# Patient Record
Sex: Male | Born: 1950 | Race: White | Hispanic: No | Marital: Married | Smoking: Former smoker
Health system: Southern US, Community
[De-identification: ages and names within clinical notes are randomized; demographics above are authoritative.]

## PROBLEM LIST (undated history)

## (undated) DIAGNOSIS — I10 Essential (primary) hypertension: Secondary | ICD-10-CM

## (undated) DIAGNOSIS — K5792 Diverticulitis of intestine, part unspecified, without perforation or abscess without bleeding: Secondary | ICD-10-CM

## (undated) DIAGNOSIS — H409 Unspecified glaucoma: Secondary | ICD-10-CM

## (undated) DIAGNOSIS — R12 Heartburn: Secondary | ICD-10-CM

## (undated) HISTORY — PX: HERNIA REPAIR: SHX51

---

## 2009-09-10 ENCOUNTER — Ambulatory Visit: Payer: Self-pay | Admitting: Emergency Medicine

## 2009-09-10 DIAGNOSIS — M25519 Pain in unspecified shoulder: Secondary | ICD-10-CM | POA: Insufficient documentation

## 2009-09-10 DIAGNOSIS — Z8719 Personal history of other diseases of the digestive system: Secondary | ICD-10-CM

## 2010-03-22 NOTE — Letter (Signed)
Summary: Work Paediatric nurse [facility]  [address]   [place], [place] [address]   Phone: [phone]  Fax: [phone]    Today's Date: September 10, 2009  Name of Patient: Zachary Howell  The above named patient had a medical visit today at:  10:40 am.  Please take this into consideration when reviewing the time away from work/school.    Special Instructions:  [  ] None  [X]  To be off the remainder of today.  [  ] To be off until the next scheduled appointment on ______________________.  [  ] Other ________________________________________________________________ ________________________________________________________________________   Sincerely yours,   Hoyt Koch MD

## 2010-03-22 NOTE — Assessment & Plan Note (Signed)
Summary: SHOULDER PAIN/KH   Vital Signs:  Patient Profile:   60 Years Old Male CC:      left shoulder pain  Height:     66.5 inches Weight:      152 pounds O2 Sat:      98 % O2 treatment:    Room Air Temp:     98.7 degrees F oral Pulse rate:   80 / minute Resp:     16 per minute BP sitting:   153 / 88  (right arm) Cuff size:   regular  Pt. in pain?   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  Mild aches here and there, but this week L shoulder feels worse.  No known trauma, although he does do a lot of lifting at work.  Pain is somtimes sore, with main symptoms being cracking and popping.  Feels deep in the joint and no pain while pressing on it.  Mobic 7.5mg  takes almost all the pain away but he ran out. No CP, SOB, palpitations, or other cardiopulmonary symptoms.  REVIEW OF SYSTEMS Constitutional Symptoms      Denies fever, chills, night sweats, weight loss, weight gain, and fatigue.  Eyes       Denies change in vision, eye pain, eye discharge, glasses, contact lenses, and eye surgery. Ear/Nose/Throat/Mouth       Denies hearing loss/aids, change in hearing, ear pain, ear discharge, dizziness, frequent runny nose, frequent nose bleeds, sinus problems, sore throat, hoarseness, and tooth pain or bleeding.  Respiratory       Denies dry cough, productive cough, wheezing, shortness of breath, asthma, bronchitis, and emphysema/COPD.  Cardiovascular       Denies murmurs, chest pain, and tires easily with exhertion.    Gastrointestinal       Denies stomach pain,  nausea/vomiting, diarrhea, constipation, blood in bowel movements, and indigestion. Genitourniary       Denies painful urination, kidney stones, and loss of urinary control. Neurological       Denies paralysis, seizures, and fainting/blackouts. Musculoskeletal       Complains of joint pain and decreased range of motion.      Denies muscle pain, joint stiffness, redness, swelling, muscle weakness, and gout.      Comments: left shouder Skin       Denies bruising, unusual mles/lumps or sores, and hair/skin or nail changes.  Psych       Denies mood changes, temper/anger issues, anxiety/stress, speech problems, depression, and sleep problems. 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 from a continuous motion   Past History:  Past Medical History: Diverticulitis, hx of  Past Surgical History: Inguinal herniorrhaphy Diverticulitis- portion of sigmoid colon removed 2006  Family History: MI- father CHF- father  Social History: Occupation: mail carrier Married Quit smoking 20 years ago Alcohol use-yes, 6/week Drug use-no Drug Use:  no Physical Exam General appearance: well developed, well nourished, no  acute distress Chest/Lungs: no rales, wheezes, or rhonchi bilateral, breath sounds equal without effort Heart: regular rate and  rhythm, no murmur Neurological: grossly intact and non-focal Skin: no obvious rashes or lesions Left shoulder: FROM AB, ER, IR.  Full stength.  Normal [place], [place], [place], [place], Speeds.  No TTP clavicle, [place], AC, acromion, coracoid.  Scapula normal movement.  No bruising, swelling. Assessment New Problems: SHOULDER PAIN, LEFT (ICD-719.41) DIVERTICULITIS, HX OF (ICD-V12.79)  Likely OA  Plan New Medications/Changes: MELOXICAM 7.5 MG TABS (MELOXICAM) 1 tab by mouth 1-2 times a day as needed for pain  #60 x 1, 09/10/2009, Hoyt Koch MD  New Orders: New Patient Level III [phone]  The patient  and/or caregiver has been counseled thoroughly with regard to medications prescribed including dosage, schedule, interactions, rationale for use, and possible side effects and they verbalize understanding.  Diagnoses and expected course of recovery discussed and will return if not improved as expected or if the condition worsens. Patient and/or caregiver verbalized understanding.  Prescriptions: MELOXICAM 7.5 MG TABS (MELOXICAM) 1 tab by mouth 1-2 times a day as needed for pain  #60 x 1   Entered and Authorized by:   Hoyt Koch MD   Signed by:   Hoyt Koch MD on 09/10/2009   Method used:   Printed then faxed to ...       [facility] (retail)       [address]       [place], [place]  [address]       Ph: [phone]       Fax: [phone]   RxID:   [id number]   Patient Instructions: 1)  Ice & general rest 2)  Discussed rotator cuff exercises 3)  Take Mobic but caution with your history of GERD 4)  If not improving or worsening in 2 weeks, return to clinic.  We will then get an Xray, and will either send to PT and/or do a Cortisone injection.   5)  If CP, SOB,  go to ER  Orders Added: 1)  New Patient Level III [[id number]]

## 2011-04-25 ENCOUNTER — Encounter: Payer: Self-pay | Admitting: *Deleted

## 2011-04-25 ENCOUNTER — Emergency Department (INDEPENDENT_AMBULATORY_CARE_PROVIDER_SITE_OTHER)
Admission: EM | Admit: 2011-04-25 | Discharge: 2011-04-25 | Disposition: A | Discharge status: Home Health | Payer: Federal, State, Local not specified - PPO | Source: Home / Self Care

## 2011-04-25 DIAGNOSIS — Z23 Encounter for immunization: Secondary | ICD-10-CM

## 2011-04-25 HISTORY — DX: Heartburn: R12

## 2011-04-25 MED ORDER — TETANUS-DIPHTH-ACELL PERTUSSIS 5-2.5-18.5 LF-MCG/0.5 IM SUSP
0.5000 mL | Freq: Once | INTRAMUSCULAR | Status: AC
  Administered 2011-04-25: 0.5 mL via INTRAMUSCULAR

## 2011-07-15 ENCOUNTER — Emergency Department (INDEPENDENT_AMBULATORY_CARE_PROVIDER_SITE_OTHER)
Admission: EM | Admit: 2011-07-15 | Discharge: 2011-07-15 | Disposition: A | Discharge status: Home / Self Care | Payer: Federal, State, Local not specified - PPO | Source: Home / Self Care

## 2011-07-15 DIAGNOSIS — M722 Plantar fascial fibromatosis: Secondary | ICD-10-CM

## 2011-07-15 DIAGNOSIS — M79609 Pain in unspecified limb: Secondary | ICD-10-CM

## 2011-07-15 DIAGNOSIS — I10 Essential (primary) hypertension: Secondary | ICD-10-CM

## 2011-07-15 LAB — BASIC METABOLIC PANEL
BUN: 15 mg/dL (ref 6–23)
Chloride: 102 mEq/L (ref 96–112)
Potassium: 4.2 mEq/L (ref 3.5–5.3)

## 2011-07-15 LAB — CBC
HCT: 40 % (ref 39.0–52.0)
RDW: 12.8 % (ref 11.5–15.5)
WBC: 6.7 10*3/uL (ref 4.0–10.5)

## 2011-07-15 NOTE — Discharge Instructions (Signed)
Plantar Fasciitis Plantar fasciitis is a common condition that causes foot pain. It is soreness (inflammation) of the band of tough fibrous tissue on the bottom of the foot that runs from the heel bone (calcaneus) to the ball of the foot. The cause of this soreness may be from excessive standing, poor fitting shoes, running on hard surfaces, being overweight, having an abnormal walk, or overuse (this is common in runners) of the painful foot or feet. It is also common in aerobic exercise dancers and ballet dancers. SYMPTOMS  Most people with plantar fasciitis complain of:  Severe pain in the morning on the bottom of their foot especially when taking the first steps out of bed. This pain recedes after a few minutes of walking.   Severe pain is experienced also during walking following a long period of inactivity.   Pain is worse when walking barefoot or up stairs  DIAGNOSIS   Your caregiver will diagnose this condition by examining and feeling your foot.   Special tests such as X-rays of your foot, are usually not needed.  PREVENTION   Consult a sports medicine professional before beginning a new exercise program.   Walking programs offer a good workout. With walking there is a lower chance of overuse injuries common to runners. There is less impact and less jarring of the joints.   Begin all new exercise programs slowly. If problems or pain develop, decrease the amount of time or distance until you are at a comfortable level.   Wear good shoes and replace them regularly.   Stretch your foot and the heel cords at the back of the ankle (Achilles tendon) both before and after exercise.   Run or exercise on even surfaces that are not hard. For example, asphalt is better than pavement.   Do not run barefoot on hard surfaces.   If using a treadmill, vary the incline.   Do not continue to workout if you have foot or joint problems. Seek professional help if they do not improve.  HOME CARE  INSTRUCTIONS   Avoid activities that cause you pain until you recover.   Use ice or cold packs on the problem or painful areas after working out.   Only take over-the-counter or prescription medicines for pain, discomfort, or fever as directed by your caregiver.   Soft shoe inserts or athletic shoes with air or gel sole cushions may be helpful.   If problems continue or become more severe, consult a sports medicine caregiver or your own health care provider. Cortisone is a potent anti-inflammatory medication that may be injected into the painful area. You can discuss this treatment with your caregiver.  MAKE SURE YOU:   Understand these instructions.   Will watch your condition.   Will get help right away if you are not doing well or get worse.  Document Released: 11/01/2000 Document Revised: 01/26/2011 Document Reviewed: 01/01/2008 ExitCare Patient Information 2012 [employer].Hypertension As your heart beats, it forces blood through your arteries. This force is your blood pressure. If the pressure is too high, it is called hypertension (HTN) or high blood pressure. HTN is dangerous because you may have it and not know it. High blood pressure may mean that your heart has to work harder to pump blood. Your arteries may be narrow or stiff. The extra work puts you at risk for heart disease, stroke, and other problems.  Blood pressure consists of two numbers, a higher number over a lower, 110/72, for example. It is   stated as "110 over 72." The ideal is below 120 for the top number (systolic) and under 80 for the bottom (diastolic). Write down your blood pressure today. You should pay close attention to your blood pressure if you have certain conditions such as:  Heart failure.   Prior heart attack.   Diabetes   Chronic kidney disease.   Prior stroke.   Multiple risk factors for heart disease.  To see if you have HTN, your blood pressure should be measured while you are seated with  your arm held at the level of the heart. It should be measured at least twice. A one-time elevated blood pressure reading (especially in the Emergency Department) does not mean that you need treatment. There may be conditions in which the blood pressure is different between your right and left arms. It is important to see your caregiver soon for a recheck. Most people have essential hypertension which means that there is not a specific cause. This type of high blood pressure may be lowered by changing lifestyle factors such as:  Stress.   Smoking.   Lack of exercise.   Excessive weight.   Drug/tobacco/alcohol use.   Eating less salt.  Most people do not have symptoms from high blood pressure until it has caused damage to the body. Effective treatment can often prevent, delay or reduce that damage. TREATMENT  When a cause has been identified, treatment for high blood pressure is directed at the cause. There are a large number of medications to treat HTN. These fall into several categories, and your caregiver will help you select the medicines that are best for you. Medications may have side effects. You should review side effects with your caregiver. If your blood pressure stays high after you have made lifestyle changes or started on medicines,   Your medication(s) may need to be changed.   Other problems may need to be addressed.   Be certain you understand your prescriptions, and know how and when to take your medicine.   Be sure to follow up with your caregiver within the time frame advised (usually within two weeks) to have your blood pressure rechecked and to review your medications.   If you are taking more than one medicine to lower your blood pressure, make sure you know how and at what times they should be taken. Taking two medicines at the same time can result in blood pressure that is too low.  SEEK IMMEDIATE MEDICAL CARE IF:  You develop a severe headache, blurred or  changing vision, or confusion.   You have unusual weakness or numbness, or a faint feeling.   You have severe chest or abdominal pain, vomiting, or breathing problems.  MAKE SURE YOU:   Understand these instructions.   Will watch your condition.   Will get help right away if you are not doing well or get worse.  Document Released: 02/06/2005 Document Revised: 01/26/2011 Document Reviewed: 09/27/2007 ExitCare Patient Information 2012 [employer]. 

## 2011-07-15 NOTE — ED Provider Notes (Signed)
Agree with exam, assessment, and plan.   Lattie Haw, MD 07/15/11 [phone]

## 2011-07-15 NOTE — ED Provider Notes (Signed)
History     CSN: [id number]  Arrival date & time 07/15/11  1133   First MD Initiated Contact with Patient 07/15/11 1155      Chief Complaint  Patient presents with  . Foot Pain    2 weeks    (Consider location/radiation/quality/duration/timing/severity/associated sxs/prior treatment) HPI Comments: Has had heel pain x 2 weeks.  Pain worse in am upon walking. Improved during throughout the day though still persists.  Works as a Museum/gallery curator.  No paresthesias or numbness.  No recent ankle traumas.    Patient is a 61 y.o. male presenting with lower extremity pain. The history is provided by the patient.  Foot Pain This is a new problem. The current episode started more than 1 week ago. The problem occurs daily. The problem has not changed since onset.Pertinent negatives include no chest pain, no abdominal pain, no headaches and no shortness of breath. The symptoms are aggravated by walking. The symptoms are relieved by NSAIDs. Treatments tried: NSAIDs  The treatment provided mild relief.    Past Medical History  Diagnosis Date  . Heart burn     Past Surgical History  Procedure Date  . Hernia repair     Family History  Problem Relation Age of Onset  . Cancer Mother     sarcoma  . Heart failure Father     History  Substance Use Topics  . Smoking status: Former Smoker -- 0.5 packs/day for 15 years  . Smokeless tobacco: Never Used  . Alcohol Use: Yes      Review of Systems  Respiratory: Negative for shortness of breath.   Cardiovascular: Negative for chest pain.  Gastrointestinal: Negative for abdominal pain.  Neurological: Negative for headaches.    Allergies  Review of patient's allergies indicates no known allergies.  Home Medications   Current Outpatient Rx  Name Route Sig Dispense Refill  . NAPROXEN SODIUM 220 MG PO CAPS Oral Take 440 mg by mouth once.      BP 179/98  Pulse 75  Temp(Src) 98.7 F (37.1 C) (Oral)  Resp 16  Ht 5\' 4"  (1.626 m)  Wt  157 lb (71.215 kg)  BMI 26.95 kg/m2  SpO2 99%  Physical Exam  Constitutional: He appears well-developed and well-nourished.  HENT:  Head: Normocephalic.  Eyes: Conjunctivae are normal. Pupils are equal, round, and reactive to light.  Neck: Normal range of motion. Neck supple.  Cardiovascular: Normal rate and regular rhythm.   Pulmonary/Chest: Effort normal and breath sounds normal.  Ankle: No visible erythema or swelling. Range of motion is full in all directions. Strength is 5/5 in all directions. Stable lateral and medial ligaments; squeeze test and kleiger test unremarkable; Talar dome nontender; No pain at base of 5th MT; No tenderness over cuboid; No tenderness over N spot or navicular prominence No tenderness on posterior aspects of lateral and medial malleolus No sign of peroneal tendon subluxations; Negative tarsal tunnel tinel's Able to walk 4 steps. + pain at insertion point of plantar fascia at calcaneus (mild) + Pain with heel walking.    ED Course  Procedures (including critical care time)   Labs Reviewed  CBC  BASIC METABOLIC PANEL   No results found.   No diagnosis found.    MDM  Exam clinically consistent with plantar fasciitis.  Will place heel lift.  Tylenol and exercises.  Follow up with sports medicine in 1-2 weeks.   Discussed NSAID avodiance in setting of elevated BPs.  Checking BUN and Cr pending  outpt follow up. No CV red flags currently.         Floydene Flock, MD 07/15/11 1233

## 2011-07-15 NOTE — ED Notes (Signed)
Patient complains of left heel pain 2 weeks. Saed does not remember injuring his foot or heel. He describes the pain as an aching, throbbing pain that is a 4/10 on the pain scale. He does take Aleve daily but the pain continues to get worse.

## 2012-12-05 ENCOUNTER — Emergency Department (INDEPENDENT_AMBULATORY_CARE_PROVIDER_SITE_OTHER)
Admission: EM | Admit: 2012-12-05 | Discharge: 2012-12-05 | Disposition: A | Discharge status: Home / Self Care | Payer: Federal, State, Local not specified - PPO | Source: Home / Self Care | Attending: Emergency Medicine | Admitting: Emergency Medicine

## 2012-12-05 ENCOUNTER — Encounter: Payer: Self-pay | Admitting: Emergency Medicine

## 2012-12-05 DIAGNOSIS — M545 Low back pain: Secondary | ICD-10-CM

## 2012-12-05 MED ORDER — CYCLOBENZAPRINE HCL 10 MG PO TABS: 10.0000 mg | ORAL_TABLET | Freq: Three times a day (TID) | ORAL | Status: DC | PRN

## 2012-12-05 MED ORDER — TRAMADOL HCL 50 MG PO TABS: 50.0000 mg | ORAL_TABLET | Freq: Four times a day (QID) | ORAL | Status: DC | PRN

## 2012-12-05 NOTE — ED Notes (Signed)
Lower Left back pain started yesterday morning, no trauma

## 2012-12-05 NOTE — ED Provider Notes (Signed)
CSN: [id number]     Arrival date & time 12/05/12  1111 History   First MD Initiated Contact with Patient 12/05/12 1127     Chief Complaint  Patient presents with  . Back Pain   (Consider location/radiation/quality/duration/timing/severity/associated sxs/prior Treatment) HPI The patient presents today with back pain. Location:  Left lower back.  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   No particular definite injury or action, but feels spasm and tight.  Finished working today and was feeling a little better, but then tightened up again.  He did have similar pain about 2 years ago which was a worker's comp injury.  He feels it was the same side and location.   Timing: 2 days Worse with: movement, rotation, bending Better with: ice, heat, rest Trauma: no Bladder/bowel incontinence: no Weakness: no Fever/chills: no Night pain: no Unexplained weight loss: no Cancer/immunosuppression: no PMH of osteoporosis or chronic steroid use:  no  No UTI symptoms.  Past Medical History  Diagnosis Date  . Heart burn    Past Surgical History  Procedure Laterality Date  . Hernia repair     Family History  Problem Relation Age of Onset  . Cancer Mother     sarcoma  . Heart failure Father    History  Substance Use Topics  . Smoking status: Former Smoker -- 0.50 packs/day for 15 years  . Smokeless tobacco: Never Used  . Alcohol Use: Yes    Review of Systems  All other systems reviewed and are negative.    Allergies  Review of patient's allergies indicates not on file.  Home Medications   Current Outpatient Rx  Name  Route  Sig  Dispense  Refill  . omeprazole (PRILOSEC) 10 MG capsule   Oral   Take 10 mg by mouth daily.         . Travoprost, BAK Free, (TRAVATAN) 0.004 % SOLN ophthalmic solution      1 drop at bedtime.         . cyclobenzaprine (FLEXERIL) 10 MG tablet   Oral   Take 1 tablet (10 mg total) by  mouth 3 (three) times daily as needed for muscle spasms.   21 tablet   0   . Naproxen Sodium 220 MG CAPS   Oral   Take 440 mg by mouth once.         . traMADol (ULTRAM) 50 MG tablet   Oral   Take 1 tablet (50 mg total) by mouth every 6 (six) hours as needed for pain.   20 tablet   0    BP 172/95  Pulse 88  Temp(Src) 98.2 F (36.8 C) (Oral)  Ht 5\' 6"  (1.676 m)  Wt 166 lb (75.297 kg)  BMI 26.81 kg/m2  SpO2 97% Physical Exam  Nursing note and vitals reviewed. Constitutional: He is oriented to person, place, and time. He appears well-developed and well-nourished.  HENT:  Head: Normocephalic and atraumatic.  Eyes: No scleral icterus.  Neck: Neck supple.  Cardiovascular: Regular rhythm and normal heart sounds.   Pulmonary/Chest: Effort normal and breath sounds normal. No respiratory distress.  Musculoskeletal:       Lumbar back: He exhibits spasm. He exhibits no bony tenderness.  L lumbar mild spasm and minimal tenderness.  Left SI dysfunction on forward bend.  SLR negative.  Distal NV intact.  No CVA tenderness.  Neurological: He is alert and oriented to  person, place, and time. He has normal strength and normal reflexes. No sensory deficit.  Skin: Skin is warm and dry. No rash noted.  Psychiatric: He has a normal mood and affect. His speech is normal.    ED Course  Procedures (including critical care time) Labs Review Labs Reviewed - No data to display Imaging Review No results found.  EKG Interpretation     Ventricular Rate:    PR Interval:    QRS Duration:   QT Interval:    QTC Calculation:   R Axis:     Text Interpretation:              MDM   1. Lumbago    1) Left LBP, likely muscular.  Will Rx Flexeril + Tramadol. Encourage heat and general rest.  Work note given for tomorrow.  Will need to f/u if pain continuing and consider Xrays at that time vs PT. 2) Continue to monitor BP outpatient and keep a log.  ER precautions given for HTN urgency  symptoms as explained.  I believe some of this is white coat, but believe he likely needs an antihypertensive med and needs to f/u with his PCP for this.    Marlaine Hind, MD 12/05/12 1149

## 2012-12-06 ENCOUNTER — Telehealth: Payer: Self-pay | Admitting: Emergency Medicine

## 2014-07-17 ENCOUNTER — Emergency Department
Admission: EM | Admit: 2014-07-17 | Discharge: 2014-07-17 | Disposition: A | Discharge status: Home / Self Care | Payer: Federal, State, Local not specified - PPO | Source: Home / Self Care | Attending: Family Medicine | Admitting: Family Medicine

## 2014-07-17 ENCOUNTER — Emergency Department (INDEPENDENT_AMBULATORY_CARE_PROVIDER_SITE_OTHER): Payer: Federal, State, Local not specified - PPO

## 2014-07-17 ENCOUNTER — Encounter: Payer: Self-pay | Admitting: Emergency Medicine

## 2014-07-17 DIAGNOSIS — M479 Spondylosis, unspecified: Secondary | ICD-10-CM

## 2014-07-17 DIAGNOSIS — S39012A Strain of muscle, fascia and tendon of lower back, initial encounter: Secondary | ICD-10-CM

## 2014-07-17 HISTORY — DX: Unspecified glaucoma: H40.9

## 2014-07-17 HISTORY — DX: Essential (primary) hypertension: I10

## 2014-07-17 HISTORY — DX: Diverticulitis of intestine, part unspecified, without perforation or abscess without bleeding: K57.92

## 2014-07-17 MED ORDER — CYCLOBENZAPRINE HCL 10 MG PO TABS: 10.0000 mg | ORAL_TABLET | Freq: Three times a day (TID) | ORAL | Status: DC | PRN

## 2014-07-17 MED ORDER — MELOXICAM 15 MG PO TABS: 15.0000 mg | ORAL_TABLET | Freq: Every day | ORAL | Status: DC

## 2014-07-17 MED ORDER — PREDNISONE 20 MG PO TABS
20.0000 mg | ORAL_TABLET | Freq: Two times a day (BID) | ORAL | Status: DC
Start: 2014-07-17 — End: 2014-11-16

## 2014-07-17 NOTE — ED Provider Notes (Signed)
CSN: [id number]     Arrival date & time 07/17/14  0813 History   First MD Initiated Contact with Patient 07/17/14 [phone]     Chief Complaint  Patient presents with  . Back Pain      HPI Comments: Patient awoke yesterday with sharp non-radiating pain in his right lower back.  The pain is worse with twisting movements.  He recalls no injury, but two days prior he had been tilling his garden with a [employer].   He denies bowel or bladder dysfunction, and no saddle numbness.    Patient is a 64 y.o. male presenting with back pain. The history is provided by the patient.  Back Pain Location:  Lumbar spine Quality:  Stabbing and shooting Radiates to:  Does not radiate Pain severity:  Mild Pain is:  Worse during the day Onset quality:  Sudden Duration:  2 days Timing:  Intermittent Progression:  Unchanged Chronicity:  Recurrent Context: twisting   Relieved by:  Nothing Worsened by:  Bending, movement and twisting Ineffective treatments:  NSAIDs and muscle relaxants Associated symptoms: no abdominal pain, no bladder incontinence, no bowel incontinence, no chest pain, no dysuria, no fever, no leg pain, no numbness, no paresthesias, no pelvic pain, no perianal numbness, no tingling, no weakness and no weight loss     Past Medical History  Diagnosis Date  . Heart burn   . Hypertension   . Glaucoma   . Diverticulitis    Past Surgical History  Procedure Laterality Date  . Hernia repair     Family History  Problem Relation Age of Onset  . Cancer Mother     sarcoma  . Heart failure Father    History  Substance Use Topics  . Smoking status: Former Smoker -- 0.50 packs/day for 15 years  . Smokeless tobacco: Never Used  . Alcohol Use: Yes    Review of Systems  Constitutional: Negative for fever and weight loss.  Cardiovascular: Negative for chest pain.  Gastrointestinal: Negative for abdominal pain and bowel incontinence.  Genitourinary: Negative for bladder incontinence,  dysuria and pelvic pain.  Musculoskeletal: Positive for back pain.  Neurological: Negative for tingling, weakness, numbness and paresthesias.  All other systems reviewed and are negative.   Allergies  Review of patient's allergies indicates no known allergies.  Home Medications   Prior to Admission medications   Medication Sig Start Date End Date Taking? Authorizing Provider  benazepril-hydrochlorthiazide (LOTENSIN HCT) 10-12.5 MG per tablet Take 1 tablet by mouth daily.   Yes Historical Provider, MD  timolol (BETIMOL) 0.25 % ophthalmic solution 1-2 drops 2 (two) times daily.   Yes Historical Provider, MD  cyclobenzaprine (FLEXERIL) 10 MG tablet Take 1 tablet (10 mg total) by mouth 3 (three) times daily as needed for muscle spasms. 07/17/14   Lattie Haw, MD  meloxicam (MOBIC) 15 MG tablet Take 1 tablet (15 mg total) by mouth daily. Take with food each morning 07/17/14   Lattie Haw, MD  Naproxen Sodium 220 MG CAPS Take 440 mg by mouth once.    Historical Provider, MD  omeprazole (PRILOSEC) 10 MG capsule Take 10 mg by mouth daily.    Historical Provider, MD  predniSONE (DELTASONE) 20 MG tablet Take 1 tablet (20 mg total) by mouth 2 (two) times daily. Take with food. 07/17/14   Lattie Haw, MD  traMADol (ULTRAM) 50 MG tablet Take 1 tablet (50 mg total) by mouth every 6 (six) hours as needed for pain. 12/05/12  Marlaine HindJeffrey H Henderson, MD  Travoprost, BAK Free, (TRAVATAN) 0.004 % SOLN ophthalmic solution 1 drop at bedtime.    Historical Provider, MD   BP 116/66 mmHg  Pulse 65  Temp(Src) 98.2 F (36.8 C) (Oral)  Resp 16  Ht 5\' 6"  (1.676 m)  Wt 160 lb (72.576 kg)  BMI 25.84 kg/m2  SpO2 97% Physical Exam  Constitutional: He is oriented to person, place, and time. He appears well-developed and well-nourished. No distress.  HENT:  Head: Normocephalic.  Eyes: Pupils are equal, round, and reactive to light.  Neck: Normal range of motion.  Cardiovascular: Normal heart sounds.     Pulmonary/Chest: Breath sounds normal.  Abdominal: There is no tenderness.  Musculoskeletal:       Lumbar back: He exhibits decreased range of motion and pain. He exhibits no tenderness, no bony tenderness, no swelling and no edema.       Back:  Back: Decreased range of motion.  Can heel/toe walk and squat without difficulty.   No tenderness to palpation; area of patient's pain marked in blue on diagram.  Straight leg raising test is negative.  Sitting knee extension test is negative.  Strength and sensation in the lower extremities is normal.  Patellar and achilles reflexes are normal   Neurological: He is oriented to person, place, and time.  Skin: Skin is warm and dry. No rash noted.  Nursing note and vitals reviewed.   ED Course  Procedures  none  Imaging Review Dg Lumbar Spine Complete  07/17/2014   CLINICAL DATA:  Recurrent left lower back pain for 2 days. No known injury. Initial encounter.  EXAM: LUMBAR SPINE - COMPLETE 4+ VIEW  COMPARISON:  None.  FINDINGS: There are 5 lumbar type vertebral bodies. The alignment is normal. The disc spaces are preserved. There is mild intervertebral spurring, greatest at L3-4. No evidence of acute fracture or pars defect. Small pelvic calcifications are likely phleboliths and dystrophic calcifications within the prostate gland.  IMPRESSION: Mild spondylosis.  No acute osseous findings or malalignment.   Electronically Signed   By: Carey BullocksWilliam  Veazey M.D.   On: 07/17/2014 09:39     MDM   1. Low back strain, initial encounter    Begin prednisone burst.  Rx for Flexeril and Mobic.  Apply ice pack for 20 to 30 minutes, 3 to 4 times daily  Continue until pain decreases.  Begin back exercises when tolerated. Begin Mobic (meloxicam) after finishing prednisone. Followup with Dr. Rodney Langtonhomas Thekkekandam ([facility]) if not improving about two weeks.     Lattie HawStephen A Amely Voorheis, MD 07/23/14 1316

## 2014-07-17 NOTE — ED Notes (Signed)
Reports awakening yesterday with shooting pain left low back; cannot think of any movement/activity that would have caused it.

## 2014-07-17 NOTE — ED Notes (Signed)
Took old rx for tramadol and flexeril.

## 2014-07-17 NOTE — Discharge Instructions (Signed)
Apply ice pack for 20 to 30 minutes, 3 to 4 times daily  Continue until pain decreases.  Begin back exercises when tolerated. Begin Mobic (meloxicam) after finishing prednisone.   Back Pain, Adult Low back pain is very common. About 1 in 5 people have back pain.The cause of low back pain is rarely dangerous. The pain often gets better over time.About half of people with a sudden onset of back pain feel better in just 2 weeks. About 8 in 10 people feel better by 6 weeks.  CAUSES Some common causes of back pain include:  Strain of the muscles or ligaments supporting the spine.  Wear and tear (degeneration) of the spinal discs.  Arthritis.  Direct injury to the back. DIAGNOSIS Most of the time, the direct cause of low back pain is not known.However, back pain can be treated effectively even when the exact cause of the pain is unknown.Answering your caregiver's questions about your overall health and symptoms is one of the most accurate ways to make sure the cause of your pain is not dangerous. If your caregiver needs more information, he or she may order lab work or imaging tests (X-rays or MRIs).However, even if imaging tests show changes in your back, this usually does not require surgery. HOME CARE INSTRUCTIONS For many people, back pain returns.Since low back pain is rarely dangerous, it is often a condition that people can learn to [facility] their own.   Remain active. It is stressful on the back to sit or stand in one place. Do not sit, drive, or stand in one place for more than 30 minutes at a time. Take short walks on level surfaces as soon as pain allows.Try to increase the length of time you walk each day.  Do not stay in bed.Resting more than 1 or 2 days can delay your recovery.  Do not avoid exercise or work.Your body is made to move.It is not dangerous to be active, even though your back may hurt.Your back will likely heal faster if you return to being active before your  pain is gone.  Pay attention to your body when you bend and lift. Many people have less discomfortwhen lifting if they bend their knees, keep the load close to their bodies,and avoid twisting. Often, the most comfortable positions are those that put less stress on your recovering back.  Find a comfortable position to sleep. Use a firm mattress and lie on your side with your knees slightly bent. If you lie on your back, put a pillow under your knees.  Only take over-the-counter or prescription medicines as directed by your caregiver. Over-the-counter medicines to reduce pain and inflammation are often the most helpful.Your caregiver may prescribe muscle relaxant drugs.These medicines help dull your pain so you can more quickly return to your normal activities and healthy exercise.  Put ice on the injured area.  Put ice in a plastic bag.  Place a towel between your skin and the bag.  Leave the ice on for 15-20 minutes, 03-04 times a day for the first 2 to 3 days. After that, ice and heat may be alternated to reduce pain and spasms.  Ask your caregiver about trying back exercises and gentle massage. This may be of some benefit.  Avoid feeling anxious or stressed.Stress increases muscle tension and can worsen back pain.It is important to recognize when you are anxious or stressed and learn ways to manage it.Exercise is a great option. SEEK MEDICAL CARE IF:  You have pain  that is not relieved with rest or medicine.  You have pain that does not improve in 1 week.  You have new symptoms.  You are generally not feeling well. SEEK IMMEDIATE MEDICAL CARE IF:   You have pain that radiates from your back into your legs.  You develop new bowel or bladder control problems.  You have unusual weakness or numbness in your arms or legs.  You develop nausea or vomiting.  You develop abdominal pain.  You feel faint. Document Released: 02/06/2005 Document Revised: 08/08/2011 Document  Reviewed: 06/10/2013 [facility] Patient Information 2015 [place], [place]. This information is not intended to replace advice given to you by your health care provider. Make sure you discuss any questions you have with your health care provider.

## 2014-11-16 ENCOUNTER — Emergency Department
Admission: EM | Admit: 2014-11-16 | Discharge: 2014-11-16 | Disposition: A | Discharge status: Home / Self Care | Payer: Federal, State, Local not specified - PPO | Source: Home / Self Care | Attending: Family Medicine | Admitting: Family Medicine

## 2014-11-16 ENCOUNTER — Encounter: Payer: Self-pay | Admitting: *Deleted

## 2014-11-16 DIAGNOSIS — S39012A Strain of muscle, fascia and tendon of lower back, initial encounter: Secondary | ICD-10-CM | POA: Diagnosis not present

## 2014-11-16 MED ORDER — CYCLOBENZAPRINE HCL 10 MG PO TABS: ORAL_TABLET | ORAL | Status: DC

## 2014-11-16 MED ORDER — PREDNISONE 50 MG PO TABS: ORAL_TABLET | ORAL | Status: DC

## 2014-11-16 MED ORDER — TRAMADOL HCL 50 MG PO TABS: 50.0000 mg | ORAL_TABLET | Freq: Four times a day (QID) | ORAL | Status: AC | PRN

## 2014-11-16 MED ORDER — MELOXICAM 15 MG PO TABS: 15.0000 mg | ORAL_TABLET | Freq: Every day | ORAL | Status: AC

## 2014-11-16 NOTE — Discharge Instructions (Signed)
Apply ice pack for 20 to 30 minutes, 3 to 4 times daily  Continue until pain decreases.  Begin Mobic after finishing prednisone.  Avoid lifting.  Begin back stretching and range of motion exercises as tolerated.

## 2014-11-16 NOTE — ED Provider Notes (Signed)
CSN: [id number]     Arrival date & time 11/16/14  1043 History   First MD Initiated Contact with Patient 11/16/14 1119     Chief Complaint  Patient presents with  . Back Pain      HPI Comments: Patient reports that he was moving branches about five days ago.  The next day he developed pain in his lower back, worse on the left, that has persisted.  He had a similar episode 4 months ago.  The pain does not radiate.   He denies bowel or bladder dysfunction, and no saddle numbness. 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  Patient is a 64 y.o. male presenting with back pain. The history is provided by the patient.  Back Pain Location:  Lumbar spine Quality:  Aching Radiates to:  Does not radiate Pain severity:  Moderate Pain is:  Worse during the day Onset quality:  Sudden Duration:  4 days Timing:  Constant Progression:  Unchanged Chronicity:  Recurrent Context: lifting heavy objects   Context: not falling and not occupational injury   Relieved by:  Nothing Worsened by:  Movement, bending and twisting Ineffective treatments:  NSAIDs Associated symptoms: no abdominal pain, no abdominal swelling, no bladder incontinence, no bowel incontinence, no chest pain, no dysuria, no fever, no headaches, no leg pain, no numbness, no paresthesias, no pelvic pain, no perianal numbness, no tingling, no weakness and no weight loss     Past Medical History  Diagnosis Date  . Heart burn   . Hypertension   . Glaucoma   . Diverticulitis    Past Surgical History  Procedure Laterality Date  . Hernia repair     Family History  Problem Relation Age of Onset  . Cancer Mother     sarcoma  . Heart failure Father    Social History  Substance Use Topics  . Smoking status: Former Smoker -- 0.50 packs/day for 15 years  . Smokeless tobacco: Never Used  . Alcohol Use: Yes    Review of Systems  Constitutional: Negative for fever and weight loss.  Cardiovascular:  Negative for chest pain.  Gastrointestinal: Negative for abdominal pain and bowel incontinence.  Genitourinary: Negative for bladder incontinence, dysuria and pelvic pain.  Musculoskeletal: Positive for back pain.  Neurological: Negative for tingling, weakness, numbness, headaches and paresthesias.  All other systems reviewed and are negative.   Allergies  Review of patient's allergies indicates no known allergies.  Home Medications   Prior to Admission medications   Medication Sig Start Date End Date Taking? Authorizing Tiandre Teall  benazepril-hydrochlorthiazide (LOTENSIN HCT) 10-12.5 MG per tablet Take 1 tablet by mouth daily.   Yes Historical Kameryn Tisdel, MD  Naproxen Sodium 220 MG CAPS Take 440 mg by mouth once.   Yes Historical Amron Guerrette, MD  cyclobenzaprine (FLEXERIL) 10 MG tablet Take one tab by mouth at bedtime for muscle spasm 11/16/14   Lattie Haw, MD  meloxicam (MOBIC) 15 MG tablet Take 1 tablet (15 mg total) by mouth daily. Take with food each morning 11/16/14   Lattie Haw, MD  omeprazole (PRILOSEC) 10 MG capsule Take 10 mg by mouth daily.    Historical Yarelin Reichardt, MD  predniSONE (DELTASONE) 50 MG tablet Take one tab by mouth with food once daily for five days 11/16/14   Lattie Haw, MD  timolol (BETIMOL) 0.25 % ophthalmic solution 1-2 drops 2 (two) times daily.    Historical Lorean Ekstrand, MD  traMADol (ULTRAM) 50 MG tablet Take  1 tablet (50 mg total) by mouth every 6 (six) hours as needed for moderate pain. 11/16/14   Lattie Haw, MD  Travoprost, BAK Free, (TRAVATAN) 0.004 % SOLN ophthalmic solution 1 drop at bedtime.    Historical Abaigeal Moomaw, MD   Meds Ordered and Administered this Visit  Medications - No data to display  BP 152/91 mmHg  Pulse 71  Resp 16  Wt 164 lb (74.39 kg)  SpO2 98% No data found.   Physical Exam Nursing notes and Vital Signs reviewed. Appearance:  Patient appears stated age, and in no acute distress Eyes:  Pupils are equal, round, and reactive  to light and accomodation.  Extraocular movement is intact.  Conjunctivae are not inflamed  Nose:   Normal Pharynx:  Normal Neck:  Supple.  No adenopathy Lungs:  Clear to auscultation.  Breath sounds are equal.  Moving air well. Heart:  Regular rate and rhythm without murmurs, rubs, or gallops.  Abdomen:  Nontender without masses or hepatosplenomegaly.  Bowel sounds are present.  No CVA or flank tenderness.  Extremities:  No edema.  No calf tenderness Skin:  No rash present.  Back:  Good range of motion.  Can heel/toe walk and squat without difficulty.  Mild tenderness to palpation in the left lumbar paraspinous area L3 to sacral.  Straight leg raising test is negative.  Sitting knee extension test is negative.  Strength and sensation in the lower extremities is normal.  Patellar and achilles reflexes are normal.  Left leg is approximately 1cm longer than the left by inspection.  ED Course  Procedures  None   MDM   1. Low back strain, initial encounter     Recurrent episode on left side.  Although probably not significant, note that left leg is approximately 1cm longer than right. Begin prednisone burst, followed by Mobic  daily.  Flexeril at bedtime.  Tramadol as needed for pain. Apply ice pack for 20 to 30 minutes, 3 to 4 times daily  Continue until pain decreases.  Begin Mobic after finishing prednisone.  Avoid lifting.  Begin back stretching and range of motion exercises as tolerated. Followup with Dr. Rodney Langton or Dr. Clementeen Graham ([facility]) if not improving about one week.    Lattie Haw, MD 11/16/14 [phone]

## 2014-11-16 NOTE — ED Notes (Signed)
Pt c/o 4-5 days of low back pain without injury. Pain does not radiate.

## 2015-01-10 ENCOUNTER — Encounter: Payer: Self-pay | Admitting: *Deleted

## 2015-01-10 ENCOUNTER — Emergency Department (INDEPENDENT_AMBULATORY_CARE_PROVIDER_SITE_OTHER): Payer: Federal, State, Local not specified - PPO

## 2015-01-10 ENCOUNTER — Emergency Department
Admission: EM | Admit: 2015-01-10 | Discharge: 2015-01-10 | Disposition: A | Discharge status: Home / Self Care | Payer: Federal, State, Local not specified - PPO | Source: Home / Self Care | Attending: Emergency Medicine | Admitting: Emergency Medicine

## 2015-01-10 DIAGNOSIS — J209 Acute bronchitis, unspecified: Secondary | ICD-10-CM

## 2015-01-10 DIAGNOSIS — M8588 Other specified disorders of bone density and structure, other site: Secondary | ICD-10-CM | POA: Diagnosis not present

## 2015-01-10 MED ORDER — AZITHROMYCIN 250 MG PO TABS: 250.0000 mg | ORAL_TABLET | Freq: Every day | ORAL | Status: DC

## 2015-01-10 MED ORDER — BENZONATATE 100 MG PO CAPS: 100.0000 mg | ORAL_CAPSULE | Freq: Three times a day (TID) | ORAL | Status: DC

## 2015-01-10 NOTE — ED Notes (Signed)
Pt complains of cough for over 1 wk.  Described as worse at night.  He feels mucus but rarely can get it up.  Cough worse at night..  He states he has had some low grade fevers on and off.

## 2015-01-10 NOTE — Discharge Instructions (Signed)

## 2015-01-10 NOTE — ED Provider Notes (Signed)
CSN: [id number]     Arrival date & time 01/10/15  1108 History   First MD Initiated Contact with Patient 01/10/15 1225     Chief Complaint  Patient presents with  . Cough   (Consider location/radiation/quality/duration/timing/severity/associated sxs/prior Treatment) Patient is a 64 y.o. male presenting with cough. The history is provided by the patient. No language interpreter was used.  Cough Cough characteristics:  Non-productive Severity:  Severe Onset quality:  Gradual Duration:  1 week Timing:  Constant Progression:  Worsening Chronicity:  New Smoker: no (former)   Context: with activity   Relieved by:  Nothing Worsened by:  Nothing tried Ineffective treatments:  None tried Associated symptoms: fever and shortness of breath   Risk factors: no recent infection     Past Medical History  Diagnosis Date  . Heart burn   . Hypertension   . Glaucoma   . Diverticulitis    Past Surgical History  Procedure Laterality Date  . Hernia repair     Family History  Problem Relation Age of Onset  . Cancer Mother     sarcoma  . Heart failure Father    Social History  Substance Use Topics  . Smoking status: Former Smoker -- 0.50 packs/day for 15 years  . Smokeless tobacco: Never Used  . Alcohol Use: Yes    Review of Systems  Constitutional: Positive for fever.  Respiratory: Positive for cough and shortness of breath.   All other systems reviewed and are negative.   Allergies  Review of patient's allergies indicates no known allergies.  Home Medications   Prior to Admission medications   Medication Sig Start Date End Date Taking? Authorizing Provider  benazepril-hydrochlorthiazide (LOTENSIN HCT) 10-12.5 MG per tablet Take 1 tablet by mouth daily.    Historical Provider, MD  cyclobenzaprine (FLEXERIL) 10 MG tablet Take one tab by mouth at bedtime for muscle spasm 11/16/14   Lattie HawStephen A Beese, MD  meloxicam (MOBIC) 15 MG tablet Take 1 tablet (15 mg total) by mouth daily.  Take with food each morning 11/16/14   Lattie HawStephen A Beese, MD  Naproxen Sodium 220 MG CAPS Take 440 mg by mouth once.    Historical Provider, MD  omeprazole (PRILOSEC) 10 MG capsule Take 10 mg by mouth daily.    Historical Provider, MD  predniSONE (DELTASONE) 50 MG tablet Take one tab by mouth with food once daily for five days 11/16/14   Lattie HawStephen A Beese, MD  timolol (BETIMOL) 0.25 % ophthalmic solution 1-2 drops 2 (two) times daily.    Historical Provider, MD  traMADol (ULTRAM) 50 MG tablet Take 1 tablet (50 mg total) by mouth every 6 (six) hours as needed for moderate pain. 11/16/14   Lattie HawStephen A Beese, MD  Travoprost, BAK Free, (TRAVATAN) 0.004 % SOLN ophthalmic solution 1 drop at bedtime.    Historical Provider, MD   Meds Ordered and Administered this Visit  Medications - No data to display  BP 149/81 mmHg  Pulse 74  Temp(Src) 98.2 F (36.8 C) (Oral)  Ht 5\' 6"  (1.676 m)  Wt 165 lb 8 oz (75.07 kg)  BMI 26.73 kg/m2  SpO2 97% No data found.   Physical Exam  Constitutional: He is oriented to person, place, and time. He appears well-developed and well-nourished.  HENT:  Head: Normocephalic and atraumatic.  Right Ear: External ear normal.  Left Ear: External ear normal.  Nose: Nose normal.  Mouth/Throat: Oropharynx is clear and moist.  Eyes: Conjunctivae and EOM are normal. Pupils are equal,  round, and reactive to light.  Neck: Normal range of motion.  Cardiovascular: Normal rate.   Pulmonary/Chest: Effort normal.  Rhonchi all lobes  Abdominal: He exhibits no distension.  Musculoskeletal: Normal range of motion.  Neurological: He is alert and oriented to person, place, and time.  Skin: Skin is warm.  Psychiatric: He has a normal mood and affect.  Nursing note and vitals reviewed.   ED Course  Procedures (including critical care time)  Labs Review Labs Reviewed - No data to display  Imaging Review Dg Chest 1 View  01/10/2015  CLINICAL DATA:  Evaluate density in the left upper  chest. EXAM: CHEST 1 VIEW COMPARISON:  01/10/2015 FINDINGS: Single oblique image of the chest was obtained. The density in the left upper chest is related to the anterior left first rib. Lungs are clear. Limited evaluation of the heart on this oblique image. IMPRESSION: The area of concern is related to the anterior left first rib. No suspicious nodule. Electronically Signed   By: Richarda Overlie M.D.   On: 01/10/2015 14:12   Dg Chest 2 View  01/10/2015  CLINICAL DATA:  Cough for 1 week. EXAM: CHEST  2 VIEW COMPARISON:  None. FINDINGS: Cardiomediastinal silhouette is normal. Mediastinal contours appear intact. There is no evidence of focal airspace consolidation, pleural effusion or pneumothorax. There is a 12 mm round hyperdensity overlying the left first rib. Osseous structures are without acute abnormality. Soft tissues are grossly normal. IMPRESSION: No evidence of focal airspace consolidation. 12 mm round hyperdensity overlying the left first rib. This may be secondary to normal overlapping bony/cartilaginous structures, as this portion of the thorax is prone to such artifacts. Repeated slightly obliqued radiograph may be considered to confirm that. Electronically Signed   By: Ted Mcalpine M.D.   On: 01/10/2015 13:11     Visual Acuity Review  Right Eye Distance:   Left Eye Distance:   Bilateral Distance:    Right Eye Near:   Left Eye Near:    Bilateral Near:         MDM Chest xray shows 12mm hyper density on initial film,  Radiologist requested oblique view.   On Oblique view this area is rib overlap.  No mass.  Due to length of illness I am going to treat patient with Zithromax.   Pt given tessalon to help with cough.  Pt is advised to see his MD for recheck in 1 week if not improved.  Pt counseled on signs and symptoms of worsening illness and need to return if any.    1. Acute bronchitis, unspecified organism    Meds ordered this encounter  Medications  . azithromycin  (ZITHROMAX) 250 MG tablet    Sig: Take 1 tablet (250 mg total) by mouth daily. Take first 2 tablets together, then 1 every day until finished.    Dispense:  6 tablet    Refill:  0    Order Specific Question:  Supervising Provider    Answer:  Georgina Pillion, DAVID [[id number]]  . benzonatate (TESSALON) 100 MG capsule    Sig: Take 1 capsule (100 mg total) by mouth every 8 (eight) hours.    Dispense:  21 capsule    Refill:  0    Order Specific Question:  Supervising Provider    Answer:  Georgina Pillion, DAVID [[id number]]    An After Visit Summary was printed and given to the patient.  Lonia Skinner [place], PA-C 01/11/15 1404

## 2016-01-22 ENCOUNTER — Emergency Department
Admission: EM | Admit: 2016-01-22 | Discharge: 2016-01-22 | Disposition: A | Discharge status: Home / Self Care | Payer: Federal, State, Local not specified - PPO | Source: Home / Self Care | Attending: Family Medicine | Admitting: Family Medicine

## 2016-01-22 ENCOUNTER — Encounter: Payer: Self-pay | Admitting: Emergency Medicine

## 2016-01-22 DIAGNOSIS — R05 Cough: Secondary | ICD-10-CM

## 2016-01-22 DIAGNOSIS — R053 Chronic cough: Secondary | ICD-10-CM

## 2016-01-22 DIAGNOSIS — J069 Acute upper respiratory infection, unspecified: Secondary | ICD-10-CM

## 2016-01-22 MED ORDER — BENZONATATE 100 MG PO CAPS: 100.0000 mg | ORAL_CAPSULE | Freq: Three times a day (TID) | ORAL | 0 refills | Status: DC

## 2016-01-22 MED ORDER — AZITHROMYCIN 250 MG PO TABS: 250.0000 mg | ORAL_TABLET | Freq: Every day | ORAL | 0 refills | Status: DC

## 2016-01-22 MED ORDER — GUAIFENESIN ER 600 MG PO TB12: 600.0000 mg | ORAL_TABLET | Freq: Two times a day (BID) | ORAL | 1 refills | Status: DC

## 2016-01-22 NOTE — ED Provider Notes (Signed)
CSN: [id number]     Arrival date & time 01/22/16  [id number] History   First MD Initiated Contact with Patient 01/22/16 [phone]     Chief Complaint  Patient presents with  . Cough   (Consider location/radiation/quality/duration/timing/severity/associated sxs/prior Treatment) HPI  Francoise SchaumannJon Twombly is a 65 y.o. male presenting to UC with c/o cough for about 1 month.  Cough has worsened this week becoming more productive with yellow sputum.  He has had some rhinorrhea and post-nasal drip.  He has tried OTC cough medication with mild relief. Denies fever, chills, n/v/d. He notes his wife was seen at this UC earlier this week for similar symptoms and prescribed an antibiotic. Denies hx of asthma, chest pain or SOB.   Past Medical History:  Diagnosis Date  . Diverticulitis   . Glaucoma   . Heart burn   . Hypertension    Past Surgical History:  Procedure Laterality Date  . HERNIA REPAIR     Family History  Problem Relation Age of Onset  . Cancer Mother     sarcoma  . Heart failure Father    Social History  Substance Use Topics  . Smoking status: Former Smoker    Packs/day: 0.50    Years: 15.00  . Smokeless tobacco: Never Used  . Alcohol use Yes    Review of Systems  Constitutional: Negative for chills and fever.  HENT: Positive for congestion, postnasal drip, rhinorrhea and sore throat. Negative for ear pain, trouble swallowing and voice change.   Respiratory: Positive for cough. Negative for shortness of breath.   Cardiovascular: Negative for chest pain and palpitations.  Gastrointestinal: Negative for abdominal pain, diarrhea, nausea and vomiting.  Musculoskeletal: Negative for arthralgias, back pain and myalgias.  Skin: Negative for rash.    Allergies  Patient has no known allergies.  Home Medications   Prior to Admission medications   Medication Sig Start Date End Date Taking? Authorizing Provider  azithromycin (ZITHROMAX) 250 MG tablet Take 1 tablet (250 mg total) by mouth  daily. Take first 2 tablets together, then 1 every day until finished. 01/22/16   Junius FinnerErin O'Malley, PA-C  benazepril-hydrochlorthiazide (LOTENSIN HCT) 10-12.5 MG per tablet Take 1 tablet by mouth daily.    Historical Provider, MD  benzonatate (TESSALON) 100 MG capsule Take 1-2 capsules (100-200 mg total) by mouth every 8 (eight) hours. 01/22/16   Junius FinnerErin O'Malley, PA-C  cyclobenzaprine (FLEXERIL) 10 MG tablet Take one tab by mouth at bedtime for muscle spasm 11/16/14   Lattie HawStephen A Beese, MD  guaiFENesin (MUCINEX) 600 MG 12 hr tablet Take 1-2 tablets (600-1,200 mg total) by mouth 2 (two) times daily. Take with large glass of water 01/22/16   Junius FinnerErin O'Malley, PA-C  meloxicam (MOBIC) 15 MG tablet Take 1 tablet (15 mg total) by mouth daily. Take with food each morning 11/16/14   Lattie HawStephen A Beese, MD  Naproxen Sodium 220 MG CAPS Take 440 mg by mouth once.    Historical Provider, MD  omeprazole (PRILOSEC) 10 MG capsule Take 10 mg by mouth daily.    Historical Provider, MD  predniSONE (DELTASONE) 50 MG tablet Take one tab by mouth with food once daily for five days 11/16/14   Lattie HawStephen A Beese, MD  timolol (BETIMOL) 0.25 % ophthalmic solution 1-2 drops 2 (two) times daily.    Historical Provider, MD  traMADol (ULTRAM) 50 MG tablet Take 1 tablet (50 mg total) by mouth every 6 (six) hours as needed for moderate pain. 11/16/14   Lattie HawStephen A Beese, MD  Travoprost, BAK Free, (TRAVATAN) 0.004 % SOLN ophthalmic solution 1 drop at bedtime.    Historical Provider, MD   Meds Ordered and Administered this Visit  Medications - No data to display  BP 139/82 (BP Location: Left Arm)   Pulse 79   Temp 98.2 F (36.8 C) (Oral)   Ht 5\' 6"  (1.676 m)   Wt 159 lb 12 oz (72.5 kg)   SpO2 97%   BMI 25.78 kg/m  No data found.   Physical Exam  Constitutional: He appears well-developed and well-nourished. No distress.  HENT:  Head: Normocephalic and atraumatic.  Right Ear: Tympanic membrane normal.  Left Ear: Tympanic membrane normal.   Nose: Mucosal edema present. Right sinus exhibits no maxillary sinus tenderness and no frontal sinus tenderness. Left sinus exhibits no maxillary sinus tenderness and no frontal sinus tenderness.  Mouth/Throat: Uvula is midline, oropharynx is clear and moist and mucous membranes are normal.  Eyes: Conjunctivae are normal. No scleral icterus.  Neck: Normal range of motion.  Cardiovascular: Normal rate, regular rhythm and normal heart sounds.   Pulmonary/Chest: Effort normal and breath sounds normal. No respiratory distress. He has no wheezes. He has no rales. He exhibits no tenderness.  Faint diffuse rhonchi. No respiratory distress.  Abdominal: Soft. He exhibits no distension. There is no tenderness.  Musculoskeletal: Normal range of motion.  Neurological: He is alert.  Skin: Skin is warm and dry. He is not diaphoretic.  Nursing note and vitals reviewed.   Urgent Care Course   Clinical Course     Procedures (including critical care time)  Labs Review Labs Reviewed - No data to display  Imaging Review No results found.   MDM   1. Persistent cough for 3 weeks or longer   2. Upper respiratory tract infection, unspecified type    Pt c/o cough for about 1 month. Sick wife at home as well. Faint diffuse rhonchi on lung exam w/o evidence of respiratory distress.  O2 Sat 97% on RA  Rx: azithromycin, tessalon, and mucinex Home care instructions provided. F/u with PCP in 1 week if not improving.     Junius Finnerrin O'Malley, PA-C 01/22/16 1348

## 2016-01-22 NOTE — ED Triage Notes (Signed)
Pt c/o cough x 1 month, worse this week, productive cough, no SOB, sinus drainage.

## 2016-02-02 IMAGING — CR DG CHEST 2V
2 series · 2 of 2 positions shown · non-contrast
Comparison: None.

CLINICAL DATA: Cough for 1 week.

EXAM:
CHEST  2 VIEW

[chest pa]
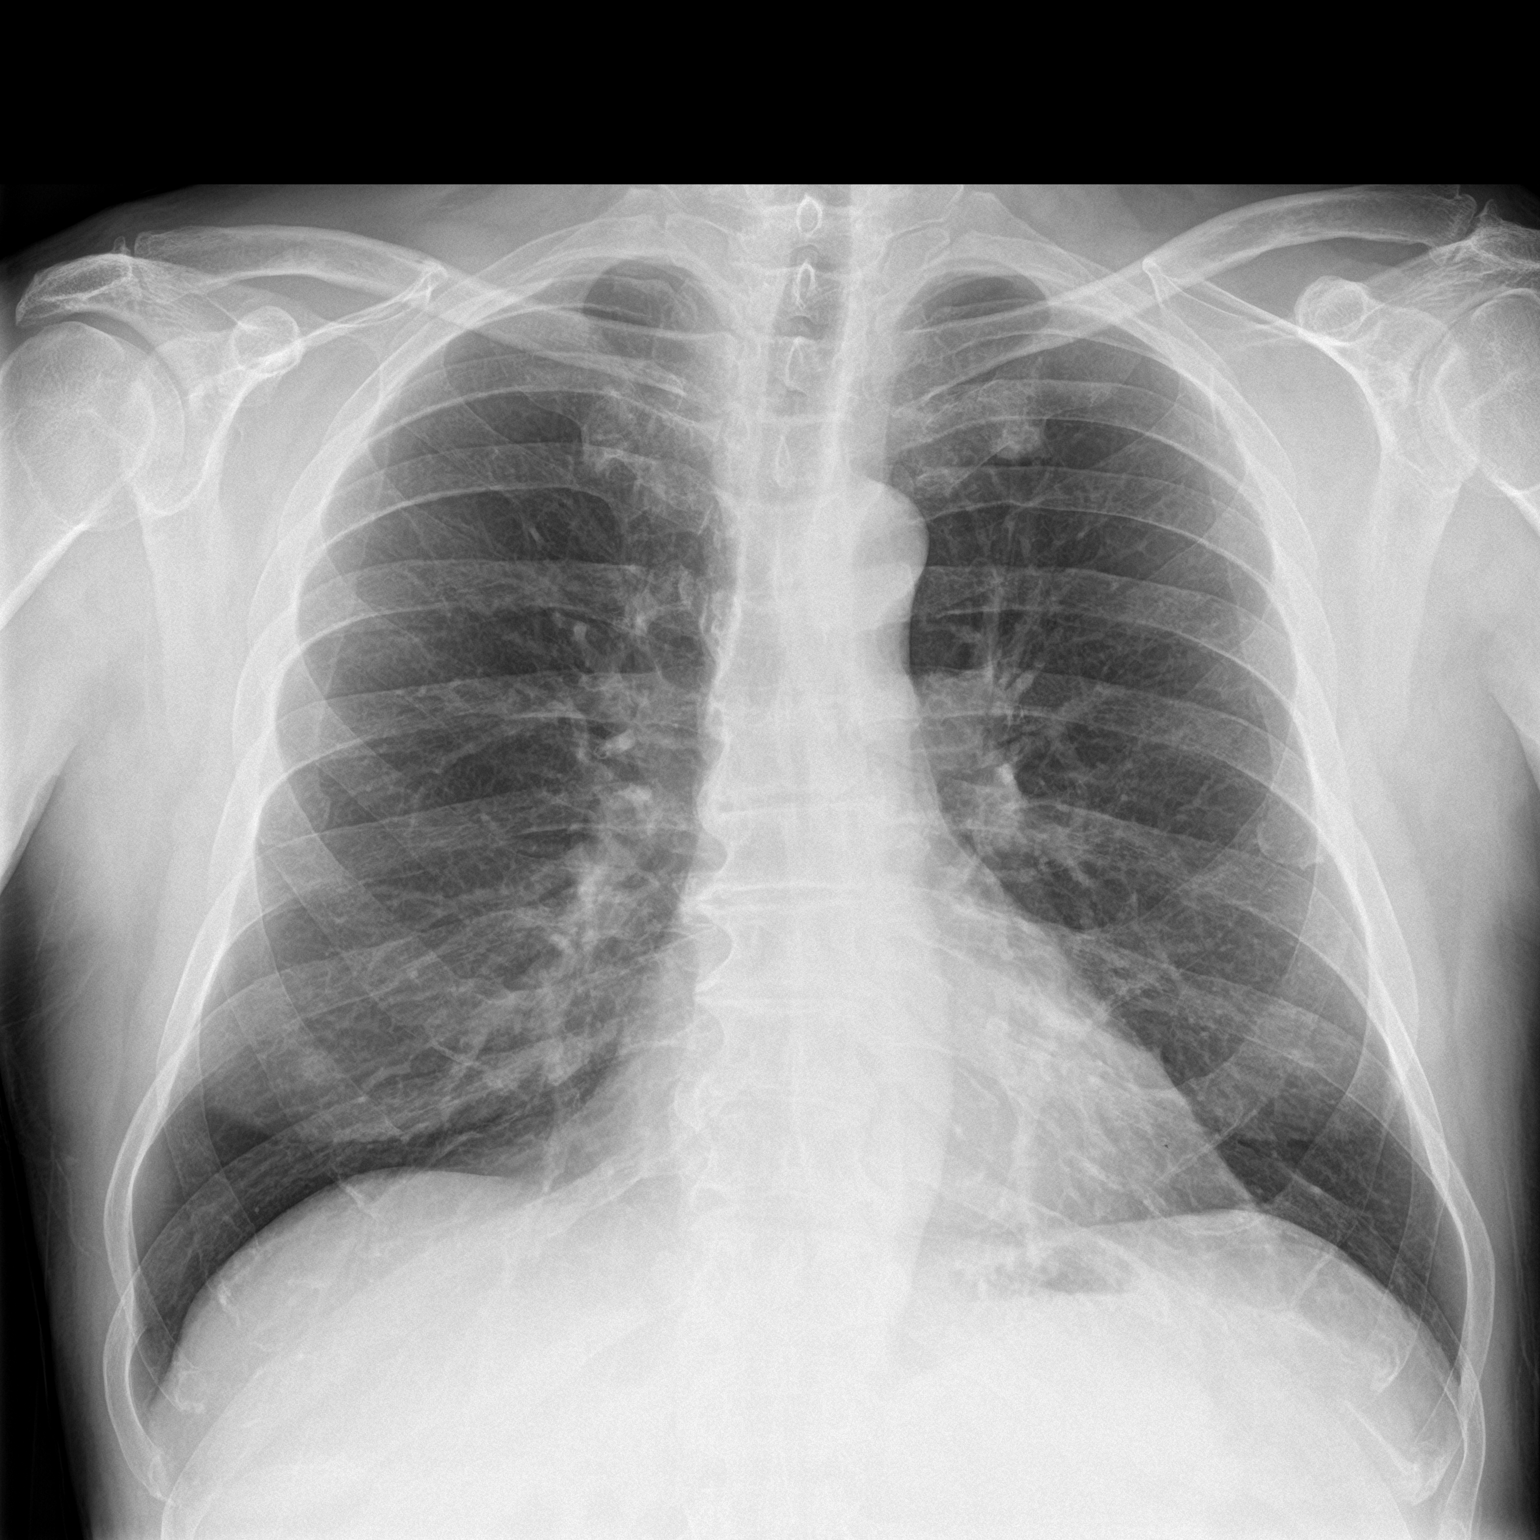

[chest lat]
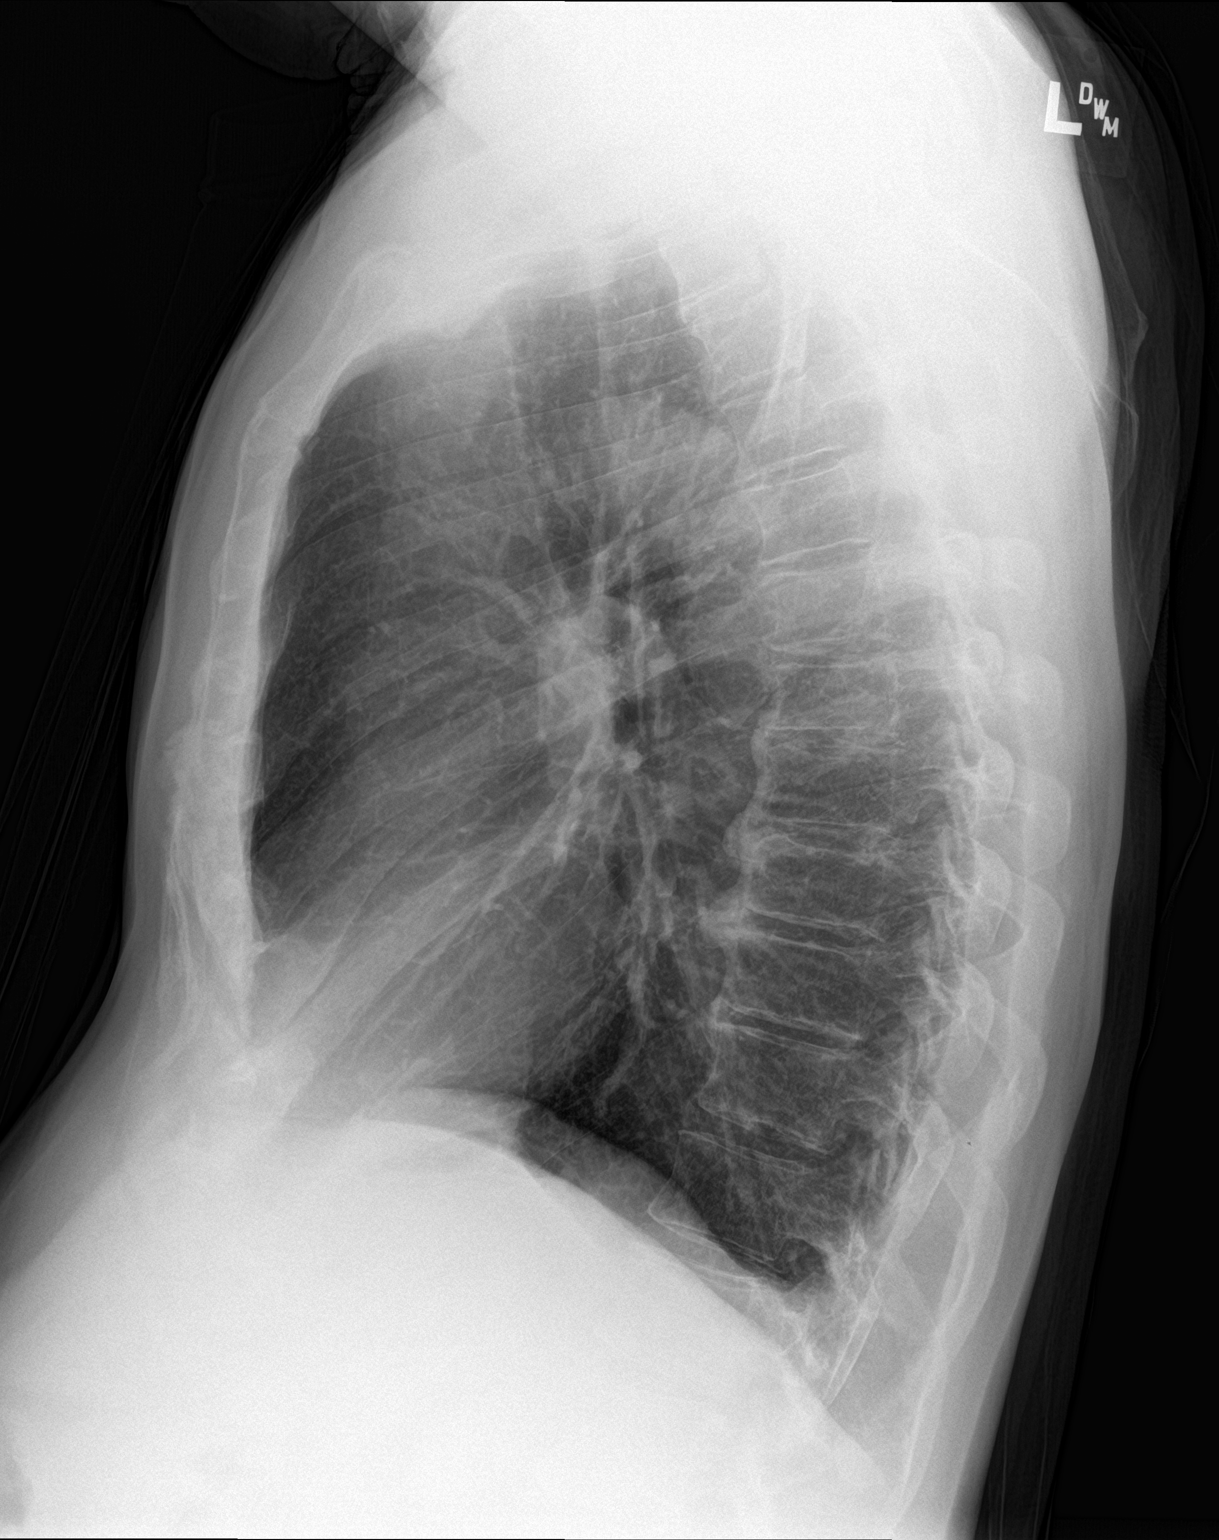

[2 of 2 positions shown; findings below may reference images not displayed]

FINDINGS: Cardiomediastinal silhouette is normal. Mediastinal contours appear
intact.

There is no evidence of focal airspace consolidation, pleural
effusion or pneumothorax. There is a 12 mm round hyperdensity
overlying the left first rib.

Osseous structures are without acute abnormality. Soft tissues are
grossly normal.
IMPRESSION: No evidence of focal airspace consolidation.

12 mm round hyperdensity overlying the left first rib. This may be
secondary to normal overlapping bony/cartilaginous structures, as
this portion of the thorax is prone to such artifacts. Repeated
slightly obliqued radiograph may be considered to confirm that.

## 2017-07-04 ENCOUNTER — Encounter: Payer: Self-pay | Admitting: *Deleted

## 2017-07-04 ENCOUNTER — Emergency Department
Admission: EM | Admit: 2017-07-04 | Discharge: 2017-07-04 | Disposition: A | Discharge status: Home / Self Care | Payer: Federal, State, Local not specified - PPO | Source: Home / Self Care | Attending: Family Medicine | Admitting: Family Medicine

## 2017-07-04 ENCOUNTER — Other Ambulatory Visit: Payer: Self-pay

## 2017-07-04 DIAGNOSIS — M62830 Muscle spasm of back: Secondary | ICD-10-CM | POA: Diagnosis not present

## 2017-07-04 MED ORDER — CYCLOBENZAPRINE HCL 10 MG PO TABS: ORAL_TABLET | ORAL | 1 refills | Status: AC

## 2017-07-04 MED ORDER — PREDNISONE 20 MG PO TABS: ORAL_TABLET | ORAL | 0 refills | Status: AC

## 2017-07-04 NOTE — ED Provider Notes (Signed)
[facility]    CSN: [id number] Arrival date & time: 07/04/17  1433     History   Chief Complaint Chief Complaint  Patient presents with  . Back Pain    HPI Zachary Howell is a 67 y.o. male.   Patient complains of intermittent right-sided non-radiating lower back pain for about two weeks.  After working in his garden with a large tiller yesterday, the pain has become significantly worse today.   He denies bowel or bladder dysfunction, and no saddle numbness.  He has had recurring similar back pain after an occupational injury about 10 years ago.  The history is provided by the patient.  Back Pain  Location:  Sacro-iliac joint Quality:  Aching Radiates to:  Does not radiate Pain severity:  Moderate Pain is:  Same all the time Onset quality:  Gradual Duration:  2 weeks Timing:  Constant Progression:  Worsening Chronicity:  Recurrent Context: lifting heavy objects   Relieved by:  Nothing Worsened by:  Bending, movement and twisting Ineffective treatments:  NSAIDs Associated symptoms: no abdominal pain, no bladder incontinence, no bowel incontinence, no dysuria, no fever, no leg pain, no numbness, no paresthesias, no pelvic pain, no perianal numbness, no tingling, no weakness and no weight loss     Past Medical History:  Diagnosis Date  . Diverticulitis   . Glaucoma   . Heart burn   . Hypertension     Patient Active Problem List   Diagnosis Date Noted  . SHOULDER PAIN, LEFT 09/10/2009  . DIVERTICULITIS, HX OF 09/10/2009    Past Surgical History:  Procedure Laterality Date  . HERNIA REPAIR         Home Medications    Prior to Admission medications   Medication Sig Start Date End Date Taking? Authorizing Provider  atorvastatin (LIPITOR) 20 MG tablet Take by mouth. 11/30/16  Yes [provider]  latanoprost (XALATAN) 0.005 % ophthalmic solution Place into both eyes every evening. 05/12/15  Yes [provider]    lisinopril-hydrochlorothiazide (PRINZIDE,ZESTORETIC) 20-25 MG tablet Take by mouth. 11/30/16  Yes [provider]  benazepril-hydrochlorthiazide (LOTENSIN HCT) 10-12.5 MG per tablet Take 1 tablet by mouth daily.    [provider]  cyclobenzaprine (FLEXERIL) 10 MG tablet Take one tab by mouth at bedtime for muscle spasm 07/04/17   Lattie Haw, MD  dorzolamide-timolol (COSOPT) 22.3-6.8 MG/ML ophthalmic solution Apply one drop to each eye 2 times a day. 05/11/17   [provider]  meloxicam (MOBIC) 15 MG tablet Take 1 tablet (15 mg total) by mouth daily. Take with food each morning 11/16/14   Lattie Haw, MD  NAPROXEN PO Take by mouth.    [provider]  Naproxen Sodium 220 MG CAPS Take 440 mg by mouth once.    [provider]  omeprazole (PRILOSEC) 20 MG capsule Take by mouth.    [provider]  predniSONE (DELTASONE) 20 MG tablet Take one tab by mouth twice daily for 4 days, then one daily. Take with food. 07/04/17   Lattie Haw, MD  sildenafil (REVATIO) 20 MG tablet Take one to five tablets as needed daily for erectile dysfunction 06/12/17   [provider]  timolol (BETIMOL) 0.25 % ophthalmic solution 1-2 drops 2 (two) times daily.    [provider]  traMADol (ULTRAM) 50 MG tablet Take 1 tablet (50 mg total) by mouth every 6 (six) hours as needed for moderate pain. 11/16/14   Lattie Haw, MD  Travoprost, BAK Free, (TRAVATAN) 0.004 % SOLN ophthalmic solution 1 drop at bedtime.    [provider]    Family History Family History  Problem Relation Age of Onset  . Cancer Mother        sarcoma  . Heart failure Father     Social History Social History   Tobacco Use  . Smoking status: Former Smoker    Packs/day: 0.50    Years: 15.00    Pack years: 7.50  . Smokeless tobacco: Never Used  Substance Use Topics  . Alcohol use: Yes  . Drug use: No     Allergies   Patient has no known  allergies.   Review of Systems Review of Systems  Constitutional: Negative for fever and weight loss.  Gastrointestinal: Negative for abdominal pain and bowel incontinence.  Genitourinary: Negative for bladder incontinence, dysuria and pelvic pain.  Musculoskeletal: Positive for back pain.  Neurological: Negative for tingling, weakness, numbness and paresthesias.  All other systems reviewed and are negative.    Physical Exam Triage Vital Signs ED Triage Vitals [07/04/17 1459]  Enc Vitals Group     BP (!) 157/93     Pulse Rate 78     Resp 16     Temp 98.2 F (36.8 C)     Temp Source Oral     SpO2 99 %     Weight 167 lb (75.8 kg)     Height      Head Circumference      Peak Flow      Pain Score 5     Pain Loc      Pain Edu?      Excl. in GC?    No data found.  Updated Vital Signs BP (!) 157/93 (BP Location: Right Arm)   Pulse 78   Temp 98.2 F (36.8 C) (Oral)   Resp 16   Wt 167 lb (75.8 kg)   SpO2 99%   BMI 26.95 kg/m   Visual Acuity Right Eye Distance:   Left Eye Distance:   Bilateral Distance:    Right Eye Near:   Left Eye Near:    Bilateral Near:     Physical Exam  Constitutional: He appears well-developed and well-nourished. No distress.  HENT:  Head: Normocephalic.  Mouth/Throat: Oropharynx is clear and moist.  Eyes: Pupils are equal, round, and reactive to light.  Neck: Normal range of motion.  Cardiovascular: Normal heart sounds.  Pulmonary/Chest: Breath sounds normal.  Abdominal: There is no tenderness.  Musculoskeletal: He exhibits no edema or tenderness.       Back:  Back:  Range of motion relatively well preserved. Pain over right SI joint with minimal tenderness to palpation. Can heel/toe walk and squat without difficulty.   Straight leg raising test is negative.  Sitting knee extension test is negative.  Strength and sensation in the lower extremities is normal.  Patellar and achilles reflexes are normal    Neurological: He is alert.    Skin: Skin is warm and dry. No rash noted.  Nursing note and vitals reviewed.    UC Treatments / Results  Labs (all labs ordered are listed, but only abnormal results are displayed) Labs Reviewed - No data to display  EKG None  Radiology No results found.  Procedures Procedures (including critical care time)  Medications Ordered in UC Medications - No data to display  Initial Impression / Assessment and Plan / UC Course  I have reviewed the triage vital signs  and the nursing notes.  Pertinent labs & imaging results that were available during my care of the patient were reviewed by me and considered in my medical decision making (see chart for details).    Begin prednisone burst/taper, and Flexeril at bedtime. Followup with Dr. Rodney Langton or Dr. Clementeen [facility] ([facility]) if not improving about two weeks.    Final Clinical Impressions(s) / UC Diagnoses   Final diagnoses:  Muscle spasm of back     Discharge Instructions     Apply ice pack for 20 to 30 minutes, 3 to 4 times daily  Continue until pain and swelling decrease. Begin range of motion and stretching exercises as tolerated.    ED Prescriptions    Medication Sig Dispense Auth. Provider   predniSONE (DELTASONE) 20 MG tablet Take one tab by mouth twice daily for 4 days, then one daily. Take with food. 12 tablet Lattie Haw, MD   cyclobenzaprine (FLEXERIL) 10 MG tablet Take one tab by mouth at bedtime for muscle spasm 15 tablet Lattie Haw, MD        Lattie Haw, MD 07/08/17 [phone]

## 2017-07-04 NOTE — Discharge Instructions (Addendum)
Apply ice pack for 20 to 30 minutes, 3 to 4 times daily  Continue until pain and swelling decrease.  Begin range of motion and stretching exercises as tolerated. 

## 2017-07-04 NOTE — ED Triage Notes (Signed)
Pt c/o RT sided LBP x 1-2 wks. He reports a hx of workers comp injury 8-10 years ago with intermittent pain in his back since. He took 2 Naprosyn yesterday.
